# Patient Record
Sex: Female | Born: 1960 | Race: White | Hispanic: No | Marital: Married | Smoking: Never smoker
Health system: Southern US, Community
[De-identification: ages and names within clinical notes are randomized; demographics above are authoritative.]

## PROBLEM LIST (undated history)

## (undated) DIAGNOSIS — M069 Rheumatoid arthritis, unspecified: Secondary | ICD-10-CM

## (undated) DIAGNOSIS — K5090 Crohn's disease, unspecified, without complications: Secondary | ICD-10-CM

## (undated) HISTORY — PX: ABDOMINAL HYSTERECTOMY: SHX81

## (undated) HISTORY — PX: COLON SURGERY: SHX602

## (undated) HISTORY — PX: BREAST SURGERY: SHX581

## (undated) HISTORY — DX: Rheumatoid arthritis, unspecified: M06.9

## (undated) HISTORY — DX: Crohn's disease, unspecified, without complications: K50.90

## (undated) HISTORY — PX: CHOLECYSTECTOMY: SHX55

## (undated) HISTORY — PX: HERNIA REPAIR: SHX51

## (undated) HISTORY — PX: TUBAL LIGATION: SHX77

---

## 1998-05-04 ENCOUNTER — Other Ambulatory Visit: Admission: RE | Admit: 1998-05-04 | Discharge: 1998-05-04 | Payer: Self-pay | Admitting: Obstetrics & Gynecology

## 1998-12-03 ENCOUNTER — Encounter (INDEPENDENT_AMBULATORY_CARE_PROVIDER_SITE_OTHER): Payer: Self-pay

## 1998-12-03 ENCOUNTER — Ambulatory Visit (HOSPITAL_COMMUNITY): Admission: RE | Admit: 1998-12-03 | Discharge: 1998-12-03 | Payer: Self-pay | Admitting: Obstetrics & Gynecology

## 1999-01-31 ENCOUNTER — Encounter (INDEPENDENT_AMBULATORY_CARE_PROVIDER_SITE_OTHER): Payer: Self-pay

## 1999-01-31 ENCOUNTER — Inpatient Hospital Stay (HOSPITAL_COMMUNITY): Admission: RE | Admit: 1999-01-31 | Discharge: 1999-02-02 | Payer: Self-pay | Admitting: Obstetrics & Gynecology

## 2001-04-17 ENCOUNTER — Encounter: Payer: Self-pay | Admitting: Internal Medicine

## 2001-04-17 ENCOUNTER — Encounter: Admission: RE | Admit: 2001-04-17 | Discharge: 2001-04-17 | Payer: Self-pay | Admitting: Internal Medicine

## 2002-02-07 ENCOUNTER — Encounter (INDEPENDENT_AMBULATORY_CARE_PROVIDER_SITE_OTHER): Payer: Self-pay | Admitting: Specialist

## 2002-02-07 ENCOUNTER — Encounter: Payer: Self-pay | Admitting: Gastroenterology

## 2002-02-07 ENCOUNTER — Ambulatory Visit (HOSPITAL_COMMUNITY): Admission: RE | Admit: 2002-02-07 | Discharge: 2002-02-07 | Payer: Self-pay | Admitting: Gastroenterology

## 2002-06-25 ENCOUNTER — Encounter: Payer: Self-pay | Admitting: Obstetrics & Gynecology

## 2002-06-25 ENCOUNTER — Encounter: Admission: RE | Admit: 2002-06-25 | Discharge: 2002-06-25 | Payer: Self-pay | Admitting: Obstetrics & Gynecology

## 2003-06-01 ENCOUNTER — Emergency Department (HOSPITAL_COMMUNITY): Admission: EM | Admit: 2003-06-01 | Discharge: 2003-06-02 | Payer: Self-pay | Admitting: Emergency Medicine

## 2003-06-04 ENCOUNTER — Ambulatory Visit (HOSPITAL_COMMUNITY): Admission: RE | Admit: 2003-06-04 | Discharge: 2003-06-04 | Payer: Self-pay | Admitting: Internal Medicine

## 2003-09-02 ENCOUNTER — Other Ambulatory Visit: Admission: RE | Admit: 2003-09-02 | Discharge: 2003-09-02 | Payer: Self-pay | Admitting: Obstetrics & Gynecology

## 2004-01-27 ENCOUNTER — Encounter: Admission: RE | Admit: 2004-01-27 | Discharge: 2004-01-27 | Payer: Self-pay | Admitting: Internal Medicine

## 2005-07-05 ENCOUNTER — Encounter: Admission: RE | Admit: 2005-07-05 | Discharge: 2005-07-05 | Payer: Self-pay | Admitting: Internal Medicine

## 2006-05-16 ENCOUNTER — Encounter: Admission: RE | Admit: 2006-05-16 | Discharge: 2006-05-16 | Payer: Self-pay | Admitting: Rheumatology

## 2006-08-31 ENCOUNTER — Encounter: Admission: RE | Admit: 2006-08-31 | Discharge: 2006-08-31 | Payer: Self-pay | Admitting: Internal Medicine

## 2006-09-15 ENCOUNTER — Encounter: Admission: RE | Admit: 2006-09-15 | Discharge: 2006-09-15 | Payer: Self-pay | Admitting: Neurology

## 2006-12-18 ENCOUNTER — Encounter: Admission: RE | Admit: 2006-12-18 | Discharge: 2006-12-18 | Payer: Self-pay | Admitting: *Deleted

## 2008-04-20 ENCOUNTER — Encounter: Admission: RE | Admit: 2008-04-20 | Discharge: 2008-04-20 | Payer: Self-pay | Admitting: Rheumatology

## 2008-04-21 ENCOUNTER — Encounter: Admission: RE | Admit: 2008-04-21 | Discharge: 2008-04-21 | Payer: Self-pay | Admitting: Rheumatology

## 2008-05-02 ENCOUNTER — Emergency Department (HOSPITAL_COMMUNITY): Admission: EM | Admit: 2008-05-02 | Discharge: 2008-05-02 | Payer: Self-pay | Admitting: Emergency Medicine

## 2009-11-11 ENCOUNTER — Observation Stay (HOSPITAL_COMMUNITY)
Admission: EM | Admit: 2009-11-11 | Discharge: 2009-11-12 | Payer: Self-pay | Source: Home / Self Care | Admitting: Emergency Medicine

## 2010-03-27 ENCOUNTER — Encounter: Payer: Self-pay | Admitting: Rheumatology

## 2010-05-19 LAB — COMPREHENSIVE METABOLIC PANEL
ALT: 45 U/L — ABNORMAL HIGH (ref 0–35)
AST: 37 U/L (ref 0–37)
Albumin: 3.3 g/dL — ABNORMAL LOW (ref 3.5–5.2)
Albumin: 3.7 g/dL (ref 3.5–5.2)
Alkaline Phosphatase: 107 U/L (ref 39–117)
Alkaline Phosphatase: 92 U/L (ref 39–117)
CO2: 26 mEq/L (ref 19–32)
Calcium: 9.4 mg/dL (ref 8.4–10.5)
Chloride: 109 mEq/L (ref 96–112)
Creatinine, Ser: 0.72 mg/dL (ref 0.4–1.2)
GFR calc Af Amer: >60 mL/min (ref >60)
Glucose, Bld: 108 mg/dL — ABNORMAL HIGH (ref 70–99)
Glucose, Bld: 108 mg/dL — ABNORMAL HIGH (ref 70–99)
Sodium: 140 mEq/L (ref 135–145)
Total Bilirubin: 0.3 mg/dL (ref 0.3–1.2)
Total Protein: 6.5 g/dL (ref 6.0–8.3)

## 2010-05-19 LAB — CBC
HCT: 40 % (ref 36.0–46.0)
Hemoglobin: 13.4 g/dL (ref 12.0–15.0)
MCHC: 33.5 g/dL (ref 30.0–36.0)
Platelets: 217 10*3/uL (ref 150–400)
RBC: 4.85 MIL/uL (ref 3.87–5.11)
WBC: 4.9 10*3/uL (ref 4.0–10.5)

## 2010-05-19 LAB — STOOL CULTURE

## 2010-05-19 LAB — URINALYSIS, ROUTINE W REFLEX MICROSCOPIC
Hgb urine dipstick: NEGATIVE
Protein, ur: 30 mg/dL — AB
Urobilinogen, UA: 0.2 mg/dL (ref 0.0–1.0)
pH: 6 (ref 5.0–8.0)

## 2010-05-19 LAB — DIFFERENTIAL
Lymphocytes Relative: 29 % (ref 12–46)
Lymphs Abs: 1.4 10*3/uL (ref 0.7–4.0)
Monocytes Absolute: 0.6 10*3/uL (ref 0.1–1.0)
Neutro Abs: 2.7 10*3/uL (ref 1.7–7.7)
Neutrophils Relative %: 55 % (ref 43–77)

## 2010-05-19 LAB — FECAL LACTOFERRIN, QUANT: Fecal Lactoferrin: POSITIVE

## 2012-02-06 ENCOUNTER — Other Ambulatory Visit: Payer: Self-pay | Admitting: Gastroenterology

## 2012-02-06 DIAGNOSIS — K5090 Crohn's disease, unspecified, without complications: Secondary | ICD-10-CM

## 2012-02-09 ENCOUNTER — Other Ambulatory Visit: Payer: Self-pay

## 2012-02-09 ENCOUNTER — Ambulatory Visit
Admission: RE | Admit: 2012-02-09 | Discharge: 2012-02-09 | Disposition: A | Discharge status: Home / Self Care | Payer: BC Managed Care – PPO | Source: Ambulatory Visit | Attending: Gastroenterology | Admitting: Gastroenterology

## 2012-02-09 DIAGNOSIS — K5090 Crohn's disease, unspecified, without complications: Secondary | ICD-10-CM

## 2012-02-09 MED ORDER — IOHEXOL 300 MG/ML  SOLN
125.0000 mL | Freq: Once | INTRAMUSCULAR | Status: AC | PRN
  Administered 2012-02-09: 125 mL via INTRAVENOUS

## 2012-02-15 ENCOUNTER — Other Ambulatory Visit: Payer: Self-pay | Admitting: Gastroenterology

## 2012-02-21 ENCOUNTER — Ambulatory Visit (HOSPITAL_COMMUNITY)
Admission: RE | Admit: 2012-02-21 | Payer: BC Managed Care – PPO | Source: Ambulatory Visit | Admitting: Gastroenterology

## 2012-02-21 ENCOUNTER — Encounter (HOSPITAL_COMMUNITY): Admission: RE | Payer: Self-pay | Source: Ambulatory Visit

## 2012-02-21 SURGERY — COLONOSCOPY
Anesthesia: Moderate Sedation

## 2012-10-25 DIAGNOSIS — K219 Gastro-esophageal reflux disease without esophagitis: Secondary | ICD-10-CM | POA: Diagnosis not present

## 2012-10-25 DIAGNOSIS — K769 Liver disease, unspecified: Secondary | ICD-10-CM | POA: Diagnosis not present

## 2012-10-25 DIAGNOSIS — Z980 Intestinal bypass and anastomosis status: Secondary | ICD-10-CM | POA: Diagnosis not present

## 2012-10-25 DIAGNOSIS — K5080 Crohn's disease of both small and large intestine without complications: Secondary | ICD-10-CM | POA: Diagnosis not present

## 2012-10-25 DIAGNOSIS — Z79899 Other long term (current) drug therapy: Secondary | ICD-10-CM | POA: Diagnosis not present

## 2012-10-25 DIAGNOSIS — M069 Rheumatoid arthritis, unspecified: Secondary | ICD-10-CM | POA: Diagnosis not present

## 2012-10-25 DIAGNOSIS — E669 Obesity, unspecified: Secondary | ICD-10-CM | POA: Diagnosis not present

## 2012-10-25 DIAGNOSIS — K644 Residual hemorrhoidal skin tags: Secondary | ICD-10-CM | POA: Diagnosis not present

## 2012-10-25 DIAGNOSIS — J45909 Unspecified asthma, uncomplicated: Secondary | ICD-10-CM | POA: Diagnosis not present

## 2012-10-25 DIAGNOSIS — K5090 Crohn's disease, unspecified, without complications: Secondary | ICD-10-CM | POA: Diagnosis not present

## 2012-10-30 DIAGNOSIS — D235 Other benign neoplasm of skin of trunk: Secondary | ICD-10-CM | POA: Diagnosis not present

## 2012-10-30 DIAGNOSIS — D2370 Other benign neoplasm of skin of unspecified lower limb, including hip: Secondary | ICD-10-CM | POA: Diagnosis not present

## 2012-10-30 DIAGNOSIS — L719 Rosacea, unspecified: Secondary | ICD-10-CM | POA: Diagnosis not present

## 2012-11-18 DIAGNOSIS — J019 Acute sinusitis, unspecified: Secondary | ICD-10-CM | POA: Diagnosis not present

## 2012-11-18 DIAGNOSIS — J209 Acute bronchitis, unspecified: Secondary | ICD-10-CM | POA: Diagnosis not present

## 2012-12-03 DIAGNOSIS — M069 Rheumatoid arthritis, unspecified: Secondary | ICD-10-CM | POA: Diagnosis not present

## 2012-12-03 DIAGNOSIS — K5090 Crohn's disease, unspecified, without complications: Secondary | ICD-10-CM | POA: Diagnosis not present

## 2012-12-03 DIAGNOSIS — R5381 Other malaise: Secondary | ICD-10-CM | POA: Diagnosis not present

## 2012-12-03 DIAGNOSIS — Z23 Encounter for immunization: Secondary | ICD-10-CM | POA: Diagnosis not present

## 2012-12-03 DIAGNOSIS — M2550 Pain in unspecified joint: Secondary | ICD-10-CM | POA: Diagnosis not present

## 2013-01-07 DIAGNOSIS — K5090 Crohn's disease, unspecified, without complications: Secondary | ICD-10-CM | POA: Diagnosis not present

## 2013-01-07 DIAGNOSIS — Z6838 Body mass index (BMI) 38.0-38.9, adult: Secondary | ICD-10-CM | POA: Diagnosis not present

## 2013-01-07 DIAGNOSIS — K439 Ventral hernia without obstruction or gangrene: Secondary | ICD-10-CM | POA: Diagnosis not present

## 2013-01-07 DIAGNOSIS — R7989 Other specified abnormal findings of blood chemistry: Secondary | ICD-10-CM | POA: Diagnosis not present

## 2013-01-24 DIAGNOSIS — H5200 Hypermetropia, unspecified eye: Secondary | ICD-10-CM | POA: Diagnosis not present

## 2013-01-27 DIAGNOSIS — Z09 Encounter for follow-up examination after completed treatment for conditions other than malignant neoplasm: Secondary | ICD-10-CM | POA: Diagnosis not present

## 2013-01-27 DIAGNOSIS — N6009 Solitary cyst of unspecified breast: Secondary | ICD-10-CM | POA: Diagnosis not present

## 2013-02-05 DIAGNOSIS — K5090 Crohn's disease, unspecified, without complications: Secondary | ICD-10-CM | POA: Diagnosis not present

## 2013-02-05 DIAGNOSIS — M2550 Pain in unspecified joint: Secondary | ICD-10-CM | POA: Diagnosis not present

## 2013-02-05 DIAGNOSIS — M069 Rheumatoid arthritis, unspecified: Secondary | ICD-10-CM | POA: Diagnosis not present

## 2013-02-05 DIAGNOSIS — R7989 Other specified abnormal findings of blood chemistry: Secondary | ICD-10-CM | POA: Diagnosis not present

## 2013-02-11 DIAGNOSIS — L259 Unspecified contact dermatitis, unspecified cause: Secondary | ICD-10-CM | POA: Diagnosis not present

## 2013-02-11 DIAGNOSIS — L821 Other seborrheic keratosis: Secondary | ICD-10-CM | POA: Diagnosis not present

## 2013-02-20 DIAGNOSIS — R1011 Right upper quadrant pain: Secondary | ICD-10-CM | POA: Diagnosis not present

## 2013-02-20 DIAGNOSIS — Z6838 Body mass index (BMI) 38.0-38.9, adult: Secondary | ICD-10-CM | POA: Diagnosis not present

## 2013-03-17 DIAGNOSIS — L408 Other psoriasis: Secondary | ICD-10-CM | POA: Diagnosis not present

## 2013-03-17 DIAGNOSIS — Z79899 Other long term (current) drug therapy: Secondary | ICD-10-CM | POA: Diagnosis not present

## 2013-03-17 DIAGNOSIS — M069 Rheumatoid arthritis, unspecified: Secondary | ICD-10-CM | POA: Diagnosis not present

## 2013-03-17 DIAGNOSIS — K5080 Crohn's disease of both small and large intestine without complications: Secondary | ICD-10-CM | POA: Diagnosis not present

## 2013-03-17 DIAGNOSIS — Z9889 Other specified postprocedural states: Secondary | ICD-10-CM | POA: Diagnosis not present

## 2013-03-17 DIAGNOSIS — K219 Gastro-esophageal reflux disease without esophagitis: Secondary | ICD-10-CM | POA: Diagnosis not present

## 2013-03-17 DIAGNOSIS — K469 Unspecified abdominal hernia without obstruction or gangrene: Secondary | ICD-10-CM | POA: Diagnosis not present

## 2013-03-31 DIAGNOSIS — K5080 Crohn's disease of both small and large intestine without complications: Secondary | ICD-10-CM | POA: Diagnosis not present

## 2013-03-31 DIAGNOSIS — L259 Unspecified contact dermatitis, unspecified cause: Secondary | ICD-10-CM | POA: Diagnosis not present

## 2013-04-15 DIAGNOSIS — E669 Obesity, unspecified: Secondary | ICD-10-CM | POA: Diagnosis not present

## 2013-04-15 DIAGNOSIS — M1990 Unspecified osteoarthritis, unspecified site: Secondary | ICD-10-CM | POA: Diagnosis not present

## 2013-04-15 DIAGNOSIS — R141 Gas pain: Secondary | ICD-10-CM | POA: Diagnosis not present

## 2013-04-15 DIAGNOSIS — Z79899 Other long term (current) drug therapy: Secondary | ICD-10-CM | POA: Diagnosis not present

## 2013-04-15 DIAGNOSIS — R143 Flatulence: Secondary | ICD-10-CM | POA: Diagnosis not present

## 2013-04-15 DIAGNOSIS — Z23 Encounter for immunization: Secondary | ICD-10-CM | POA: Diagnosis not present

## 2013-04-15 DIAGNOSIS — K7689 Other specified diseases of liver: Secondary | ICD-10-CM | POA: Diagnosis not present

## 2013-04-15 DIAGNOSIS — E781 Pure hyperglyceridemia: Secondary | ICD-10-CM | POA: Diagnosis not present

## 2013-04-15 DIAGNOSIS — N951 Menopausal and female climacteric states: Secondary | ICD-10-CM | POA: Diagnosis not present

## 2013-04-15 DIAGNOSIS — M069 Rheumatoid arthritis, unspecified: Secondary | ICD-10-CM | POA: Diagnosis not present

## 2013-04-15 DIAGNOSIS — Z0000 Encounter for general adult medical examination without abnormal findings: Secondary | ICD-10-CM | POA: Diagnosis not present

## 2013-04-15 DIAGNOSIS — K5090 Crohn's disease, unspecified, without complications: Secondary | ICD-10-CM | POA: Diagnosis not present

## 2013-04-15 DIAGNOSIS — G589 Mononeuropathy, unspecified: Secondary | ICD-10-CM | POA: Diagnosis not present

## 2013-05-06 DIAGNOSIS — M069 Rheumatoid arthritis, unspecified: Secondary | ICD-10-CM | POA: Diagnosis not present

## 2013-05-06 DIAGNOSIS — K5090 Crohn's disease, unspecified, without complications: Secondary | ICD-10-CM | POA: Diagnosis not present

## 2013-05-06 DIAGNOSIS — M2550 Pain in unspecified joint: Secondary | ICD-10-CM | POA: Diagnosis not present

## 2013-05-06 DIAGNOSIS — R21 Rash and other nonspecific skin eruption: Secondary | ICD-10-CM | POA: Diagnosis not present

## 2013-05-26 DIAGNOSIS — R7989 Other specified abnormal findings of blood chemistry: Secondary | ICD-10-CM | POA: Diagnosis not present

## 2013-06-03 DIAGNOSIS — Z79899 Other long term (current) drug therapy: Secondary | ICD-10-CM | POA: Diagnosis not present

## 2013-06-03 DIAGNOSIS — M069 Rheumatoid arthritis, unspecified: Secondary | ICD-10-CM | POA: Diagnosis not present

## 2013-06-03 DIAGNOSIS — R7402 Elevation of levels of lactic acid dehydrogenase (LDH): Secondary | ICD-10-CM | POA: Diagnosis not present

## 2013-06-03 DIAGNOSIS — R7401 Elevation of levels of liver transaminase levels: Secondary | ICD-10-CM | POA: Diagnosis not present

## 2013-06-03 DIAGNOSIS — L568 Other specified acute skin changes due to ultraviolet radiation: Secondary | ICD-10-CM | POA: Diagnosis not present

## 2013-06-03 DIAGNOSIS — L408 Other psoriasis: Secondary | ICD-10-CM | POA: Diagnosis not present

## 2013-06-03 DIAGNOSIS — K5090 Crohn's disease, unspecified, without complications: Secondary | ICD-10-CM | POA: Diagnosis not present

## 2013-07-06 IMAGING — CT CT ENTEROGRAPHY (ABD-PELV W/ CM)
2 of 6 series · 11 of 36 positions shown, 18 images · IV contrast (VOLUMEN [ID] ML & [ID] OMNI 300)
Comparison: CT scan 01/22/2012.

CLINICAL DATA: Crohn's disease and right-sided abdominal pain.

CT ABDOMEN AND PELVIS WITH CONTRAST (CT ENTEROGRAPHY)
TECHNIQUE: Multidetector CT of the abdomen and pelvis during bolus
administration of intravenous contrast. Negative oral contrast
VoLumen was given.
Contrast: 125mL OMNIPAQUE IOHEXOL 300 MG/ML  SOLN

[Series 3: enterography (id) · axial · 0.74mm/px · z∈[-475,-87]mm · 10 of 186 slices shown, 16 images]
[im 16/186  soft-tissue]
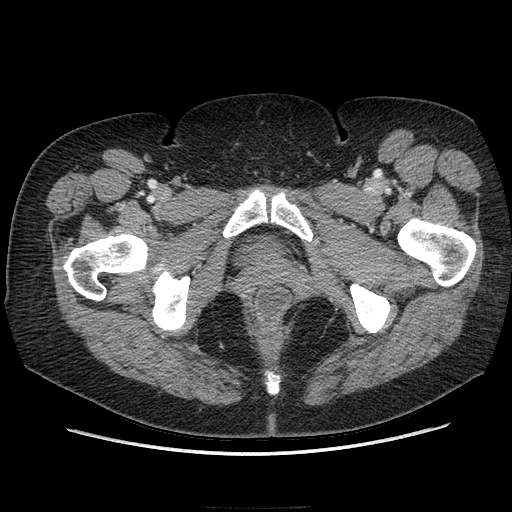
[im 16/186  bone]
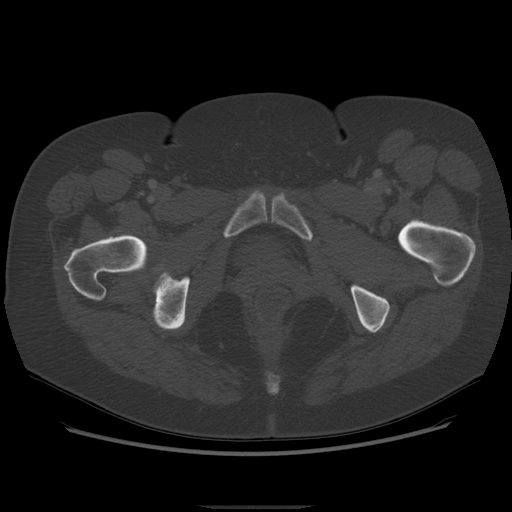
[im 31/186  soft-tissue]
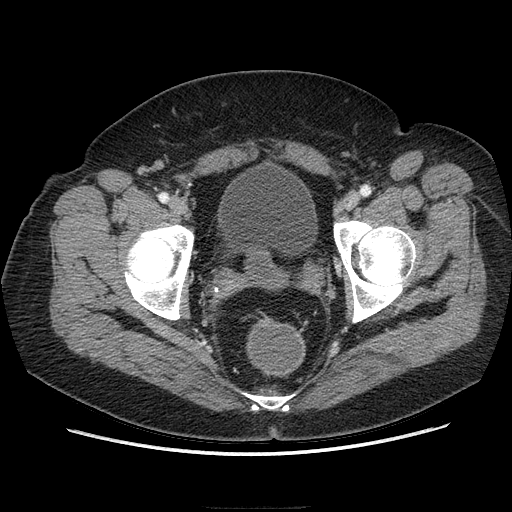
[im 47/186  soft-tissue]
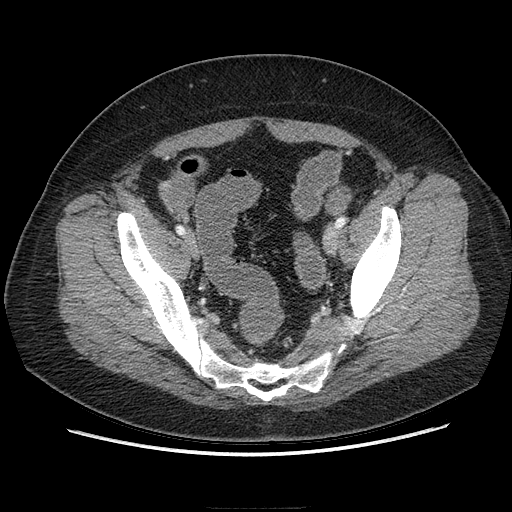
[im 62/186  soft-tissue]
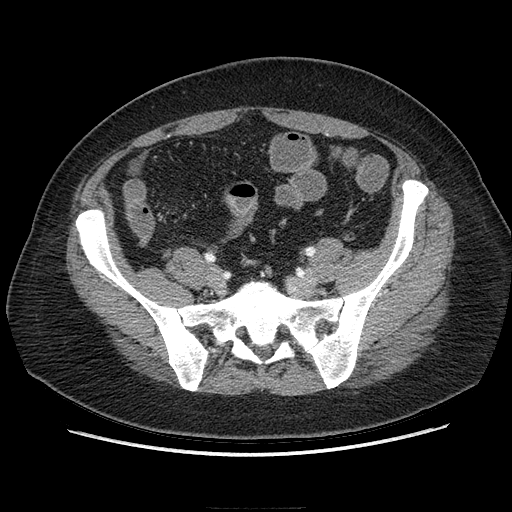
[im 78/186  soft-tissue]
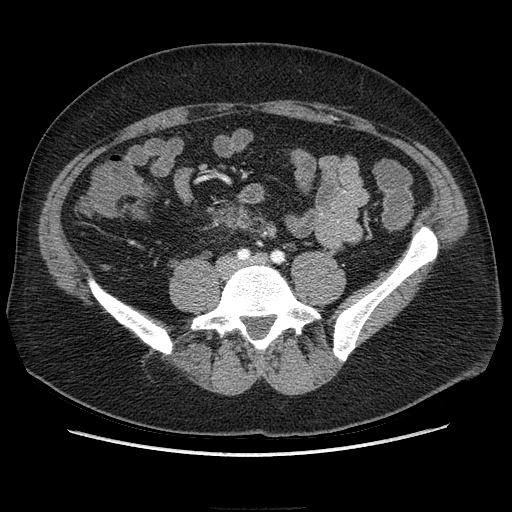
[im 108/186  soft-tissue]
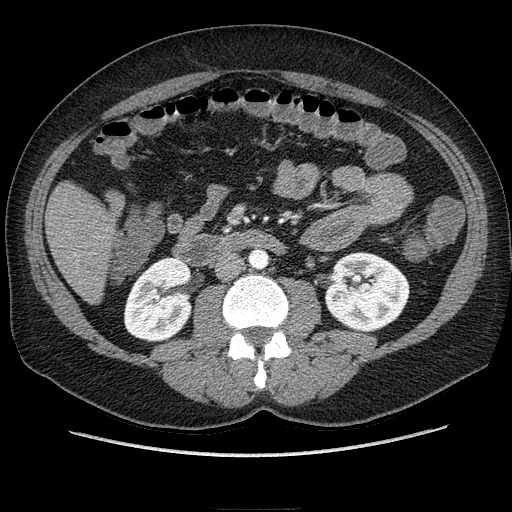
[im 124/186  soft-tissue]
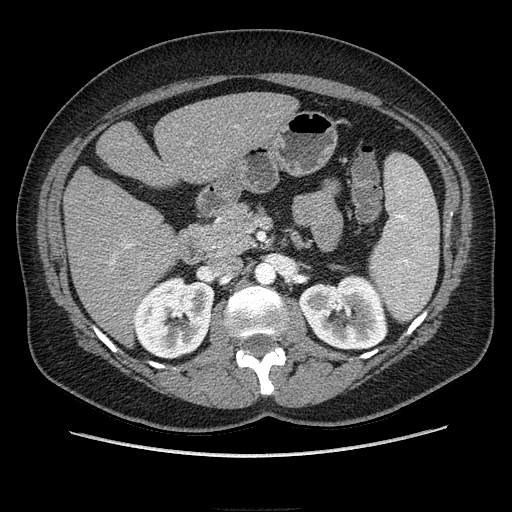
[im 124/186  lung]
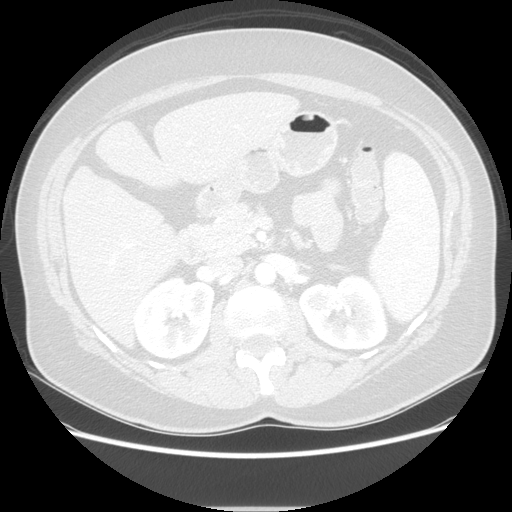
[im 139/186  soft-tissue]
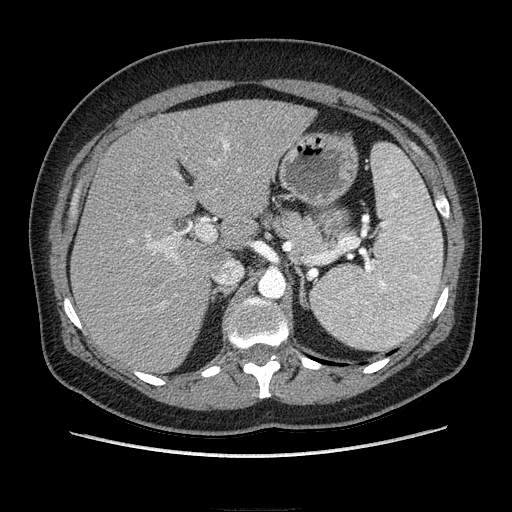
[im 139/186  lung]
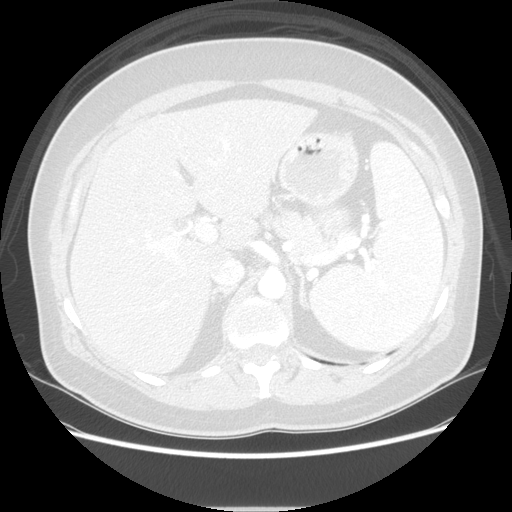
[im 155/186  soft-tissue]
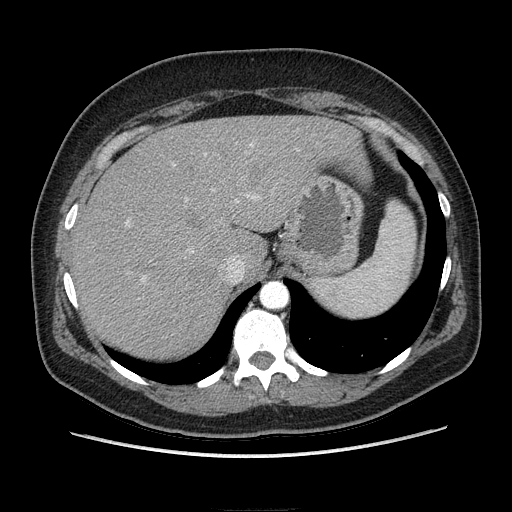
[im 155/186  lung]
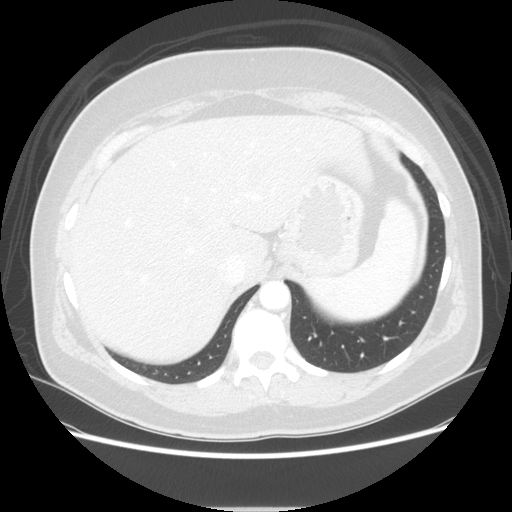
[im 155/186  bone]
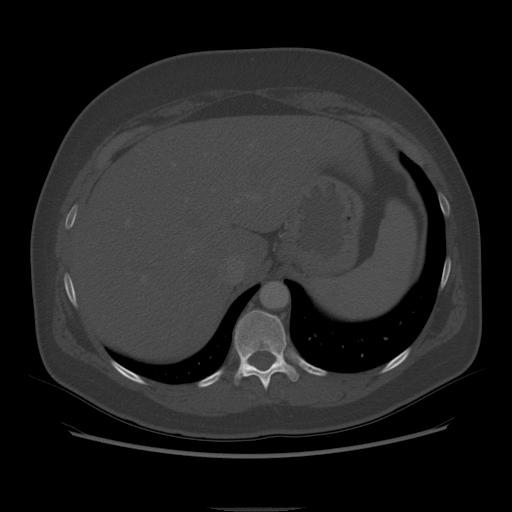
[im 170/186  soft-tissue]
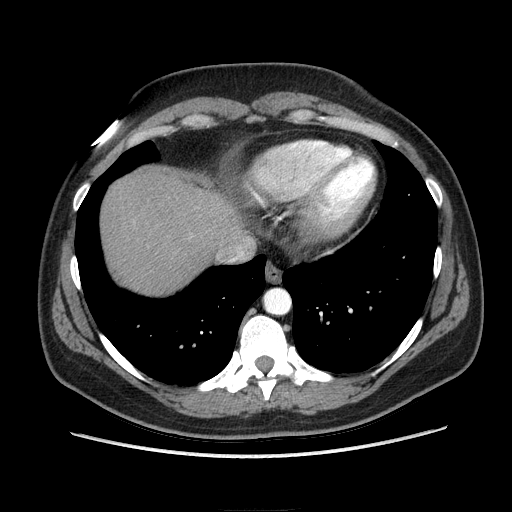
[im 170/186  lung]
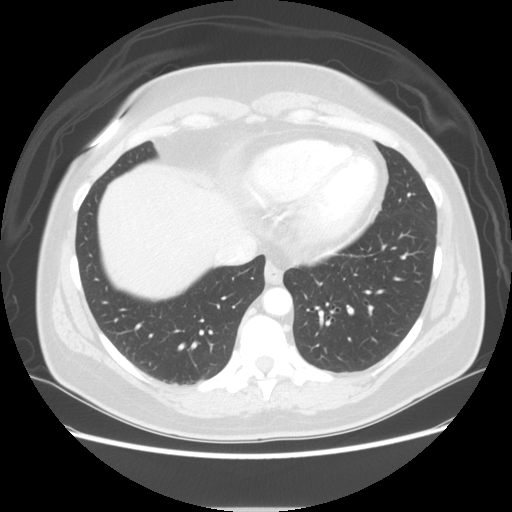

[Series 601: coronal body · coronal · 0.95mm/px · 1 of 126 slices shown, 2 images]
[im 42/126  soft-tissue]
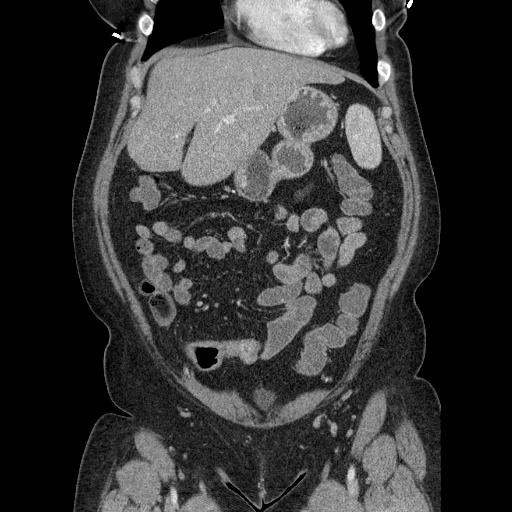
[im 42/126  bone]
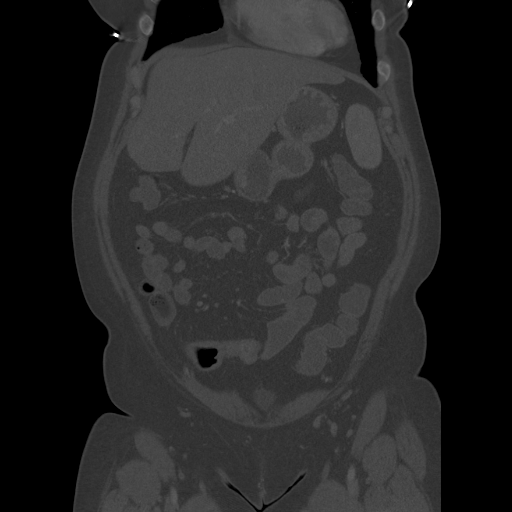

[11 of 36 positions shown; findings below may reference images not displayed]

FINDINGS: The lung bases are clear.  No pleural effusion or
pulmonary nodule.

There is mild diffuse fatty infiltration of the liver but no focal
hepatic lesions or intrahepatic biliary dilatation.  The
gallbladder surgically absent.  No common bile duct dilatation.
The pancreas is normal.  The spleen is borderline enlarged but
stable.  No focal lesions.  The adrenal glands and kidneys are
normal.

The stomach, duodenum and proximal small bowel are normal.  As
demonstrated on the prior CT scan there is diffuse small bowel wall
thickening involving the distal ileum with submucosal edema and
mucosal enhancement.  Findings consist with active Crohn's disease.
The terminal ileum appears normal and the cecum is normal.  The
appendix is normal.  In the small bowel mesentery near the inflamed
small bowel loops is an ill-defined inflammatory "mass".  This is
larger and more discrete than it was on the prior CT examination.
This is concerning for a phlegmonous area possibly due to a
fistulous tract from the inflamed small bowel loop (see coronal
image 63). No discrete drainable abscess is identified.  There are
a few small scattered adjacent lymph nodes.

The uterus is surgically absent.  No pelvic mass, adenopathy or
free pelvic fluid collections.  The rectum and sigmoid colon are
unremarkable.

No significant bony findings.
IMPRESSION: 1.  Persistent inflammation of the distal ileum consistent with
active Crohn's disease.
2.  Enlarging and more distinct inflammatory process in the small
bowel mesentery worrisome for a fistulous communication and
phlegmon.  No discrete drainable abscess.  Recommend close
surveillance.
3.  No inflammatory bowel disease involving the colon.
4.  Stable diffuse fatty infiltration of the liver and mild splenic
enlargement.

## 2013-08-05 DIAGNOSIS — R7989 Other specified abnormal findings of blood chemistry: Secondary | ICD-10-CM | POA: Diagnosis not present

## 2013-08-05 DIAGNOSIS — K5090 Crohn's disease, unspecified, without complications: Secondary | ICD-10-CM | POA: Diagnosis not present

## 2013-08-05 DIAGNOSIS — M069 Rheumatoid arthritis, unspecified: Secondary | ICD-10-CM | POA: Diagnosis not present

## 2013-08-05 DIAGNOSIS — M2550 Pain in unspecified joint: Secondary | ICD-10-CM | POA: Diagnosis not present

## 2013-08-05 DIAGNOSIS — Z79899 Other long term (current) drug therapy: Secondary | ICD-10-CM | POA: Diagnosis not present

## 2013-08-12 DIAGNOSIS — M069 Rheumatoid arthritis, unspecified: Secondary | ICD-10-CM | POA: Diagnosis not present

## 2013-08-12 DIAGNOSIS — M2550 Pain in unspecified joint: Secondary | ICD-10-CM | POA: Diagnosis not present

## 2013-08-12 DIAGNOSIS — M549 Dorsalgia, unspecified: Secondary | ICD-10-CM | POA: Diagnosis not present

## 2013-08-12 DIAGNOSIS — K5090 Crohn's disease, unspecified, without complications: Secondary | ICD-10-CM | POA: Diagnosis not present

## 2013-08-25 DIAGNOSIS — K739 Chronic hepatitis, unspecified: Secondary | ICD-10-CM | POA: Diagnosis not present

## 2013-08-25 DIAGNOSIS — R7989 Other specified abnormal findings of blood chemistry: Secondary | ICD-10-CM | POA: Diagnosis not present

## 2013-08-25 DIAGNOSIS — K7460 Unspecified cirrhosis of liver: Secondary | ICD-10-CM | POA: Diagnosis not present

## 2013-08-25 DIAGNOSIS — M069 Rheumatoid arthritis, unspecified: Secondary | ICD-10-CM | POA: Diagnosis not present

## 2013-08-25 DIAGNOSIS — K5090 Crohn's disease, unspecified, without complications: Secondary | ICD-10-CM | POA: Diagnosis not present

## 2013-08-25 DIAGNOSIS — K769 Liver disease, unspecified: Secondary | ICD-10-CM | POA: Diagnosis not present

## 2013-09-02 DIAGNOSIS — Z09 Encounter for follow-up examination after completed treatment for conditions other than malignant neoplasm: Secondary | ICD-10-CM | POA: Diagnosis not present

## 2013-09-02 DIAGNOSIS — R928 Other abnormal and inconclusive findings on diagnostic imaging of breast: Secondary | ICD-10-CM | POA: Diagnosis not present

## 2013-09-10 DIAGNOSIS — K769 Liver disease, unspecified: Secondary | ICD-10-CM | POA: Diagnosis not present

## 2013-09-10 DIAGNOSIS — R160 Hepatomegaly, not elsewhere classified: Secondary | ICD-10-CM | POA: Diagnosis not present

## 2013-09-10 DIAGNOSIS — R161 Splenomegaly, not elsewhere classified: Secondary | ICD-10-CM | POA: Diagnosis not present

## 2013-09-10 DIAGNOSIS — R7989 Other specified abnormal findings of blood chemistry: Secondary | ICD-10-CM | POA: Diagnosis not present

## 2013-09-10 DIAGNOSIS — K7689 Other specified diseases of liver: Secondary | ICD-10-CM | POA: Diagnosis not present

## 2013-10-07 DIAGNOSIS — M069 Rheumatoid arthritis, unspecified: Secondary | ICD-10-CM | POA: Diagnosis not present

## 2013-10-07 DIAGNOSIS — R7989 Other specified abnormal findings of blood chemistry: Secondary | ICD-10-CM | POA: Diagnosis not present

## 2013-10-07 DIAGNOSIS — L408 Other psoriasis: Secondary | ICD-10-CM | POA: Diagnosis not present

## 2013-10-07 DIAGNOSIS — K5090 Crohn's disease, unspecified, without complications: Secondary | ICD-10-CM | POA: Diagnosis not present

## 2013-10-07 DIAGNOSIS — Z79899 Other long term (current) drug therapy: Secondary | ICD-10-CM | POA: Diagnosis not present

## 2013-10-07 DIAGNOSIS — K651 Peritoneal abscess: Secondary | ICD-10-CM | POA: Diagnosis not present

## 2013-10-07 DIAGNOSIS — R945 Abnormal results of liver function studies: Secondary | ICD-10-CM | POA: Diagnosis not present

## 2013-10-07 DIAGNOSIS — E559 Vitamin D deficiency, unspecified: Secondary | ICD-10-CM | POA: Diagnosis not present

## 2013-10-17 DIAGNOSIS — K5090 Crohn's disease, unspecified, without complications: Secondary | ICD-10-CM | POA: Diagnosis not present

## 2013-10-21 DIAGNOSIS — R5381 Other malaise: Secondary | ICD-10-CM | POA: Diagnosis not present

## 2013-10-21 DIAGNOSIS — K5090 Crohn's disease, unspecified, without complications: Secondary | ICD-10-CM | POA: Diagnosis not present

## 2013-10-21 DIAGNOSIS — F4321 Adjustment disorder with depressed mood: Secondary | ICD-10-CM | POA: Diagnosis not present

## 2013-10-21 DIAGNOSIS — M069 Rheumatoid arthritis, unspecified: Secondary | ICD-10-CM | POA: Diagnosis not present

## 2013-10-21 DIAGNOSIS — R030 Elevated blood-pressure reading, without diagnosis of hypertension: Secondary | ICD-10-CM | POA: Diagnosis not present

## 2013-10-21 DIAGNOSIS — E669 Obesity, unspecified: Secondary | ICD-10-CM | POA: Diagnosis not present

## 2013-10-21 DIAGNOSIS — D649 Anemia, unspecified: Secondary | ICD-10-CM | POA: Diagnosis not present

## 2013-10-21 DIAGNOSIS — Z1331 Encounter for screening for depression: Secondary | ICD-10-CM | POA: Diagnosis not present

## 2013-10-21 DIAGNOSIS — K7689 Other specified diseases of liver: Secondary | ICD-10-CM | POA: Diagnosis not present

## 2013-10-21 DIAGNOSIS — R5383 Other fatigue: Secondary | ICD-10-CM | POA: Diagnosis not present

## 2013-10-27 DIAGNOSIS — K5080 Crohn's disease of both small and large intestine without complications: Secondary | ICD-10-CM | POA: Diagnosis not present

## 2013-10-30 DIAGNOSIS — D2370 Other benign neoplasm of skin of unspecified lower limb, including hip: Secondary | ICD-10-CM | POA: Diagnosis not present

## 2013-10-30 DIAGNOSIS — D235 Other benign neoplasm of skin of trunk: Secondary | ICD-10-CM | POA: Diagnosis not present

## 2013-10-30 DIAGNOSIS — L821 Other seborrheic keratosis: Secondary | ICD-10-CM | POA: Diagnosis not present

## 2013-10-30 DIAGNOSIS — C44711 Basal cell carcinoma of skin of unspecified lower limb, including hip: Secondary | ICD-10-CM | POA: Diagnosis not present

## 2013-11-07 DIAGNOSIS — K5080 Crohn's disease of both small and large intestine without complications: Secondary | ICD-10-CM | POA: Diagnosis not present

## 2013-11-07 DIAGNOSIS — Z881 Allergy status to other antibiotic agents status: Secondary | ICD-10-CM | POA: Diagnosis not present

## 2013-11-07 DIAGNOSIS — Z980 Intestinal bypass and anastomosis status: Secondary | ICD-10-CM | POA: Diagnosis not present

## 2013-11-07 DIAGNOSIS — M069 Rheumatoid arthritis, unspecified: Secondary | ICD-10-CM | POA: Diagnosis not present

## 2013-11-07 DIAGNOSIS — Z882 Allergy status to sulfonamides status: Secondary | ICD-10-CM | POA: Diagnosis not present

## 2013-11-07 DIAGNOSIS — K219 Gastro-esophageal reflux disease without esophagitis: Secondary | ICD-10-CM | POA: Diagnosis not present

## 2013-11-07 DIAGNOSIS — K644 Residual hemorrhoidal skin tags: Secondary | ICD-10-CM | POA: Diagnosis not present

## 2013-11-12 DIAGNOSIS — K5090 Crohn's disease, unspecified, without complications: Secondary | ICD-10-CM | POA: Diagnosis not present

## 2013-11-14 DIAGNOSIS — J189 Pneumonia, unspecified organism: Secondary | ICD-10-CM | POA: Diagnosis not present

## 2013-12-01 DIAGNOSIS — K5090 Crohn's disease, unspecified, without complications: Secondary | ICD-10-CM | POA: Diagnosis not present

## 2013-12-10 DIAGNOSIS — K50819 Crohn's disease of both small and large intestine with unspecified complications: Secondary | ICD-10-CM | POA: Diagnosis not present

## 2013-12-15 DIAGNOSIS — Z23 Encounter for immunization: Secondary | ICD-10-CM | POA: Diagnosis not present

## 2014-01-06 DIAGNOSIS — K50819 Crohn's disease of both small and large intestine with unspecified complications: Secondary | ICD-10-CM | POA: Diagnosis not present

## 2014-02-05 DIAGNOSIS — K50819 Crohn's disease of both small and large intestine with unspecified complications: Secondary | ICD-10-CM | POA: Diagnosis not present

## 2014-03-23 DIAGNOSIS — Z79899 Other long term (current) drug therapy: Secondary | ICD-10-CM | POA: Diagnosis not present

## 2014-03-23 DIAGNOSIS — H5203 Hypermetropia, bilateral: Secondary | ICD-10-CM | POA: Diagnosis not present

## 2014-04-02 DIAGNOSIS — K50819 Crohn's disease of both small and large intestine with unspecified complications: Secondary | ICD-10-CM | POA: Diagnosis not present

## 2014-04-14 DIAGNOSIS — K5090 Crohn's disease, unspecified, without complications: Secondary | ICD-10-CM | POA: Diagnosis not present

## 2014-04-14 DIAGNOSIS — L539 Erythematous condition, unspecified: Secondary | ICD-10-CM | POA: Diagnosis not present

## 2014-04-14 DIAGNOSIS — Z6837 Body mass index (BMI) 37.0-37.9, adult: Secondary | ICD-10-CM | POA: Diagnosis not present

## 2014-04-14 DIAGNOSIS — R7989 Other specified abnormal findings of blood chemistry: Secondary | ICD-10-CM | POA: Diagnosis not present

## 2014-04-14 DIAGNOSIS — Z79899 Other long term (current) drug therapy: Secondary | ICD-10-CM | POA: Diagnosis not present

## 2014-04-14 DIAGNOSIS — K219 Gastro-esophageal reflux disease without esophagitis: Secondary | ICD-10-CM | POA: Diagnosis not present

## 2014-04-14 DIAGNOSIS — L409 Psoriasis, unspecified: Secondary | ICD-10-CM | POA: Diagnosis not present

## 2014-04-14 DIAGNOSIS — K50819 Crohn's disease of both small and large intestine with unspecified complications: Secondary | ICD-10-CM | POA: Diagnosis not present

## 2014-04-14 DIAGNOSIS — M069 Rheumatoid arthritis, unspecified: Secondary | ICD-10-CM | POA: Diagnosis not present

## 2014-04-30 DIAGNOSIS — R030 Elevated blood-pressure reading, without diagnosis of hypertension: Secondary | ICD-10-CM | POA: Diagnosis not present

## 2014-04-30 DIAGNOSIS — Z1389 Encounter for screening for other disorder: Secondary | ICD-10-CM | POA: Diagnosis not present

## 2014-04-30 DIAGNOSIS — R739 Hyperglycemia, unspecified: Secondary | ICD-10-CM | POA: Diagnosis not present

## 2014-04-30 DIAGNOSIS — Z23 Encounter for immunization: Secondary | ICD-10-CM | POA: Diagnosis not present

## 2014-04-30 DIAGNOSIS — M791 Myalgia: Secondary | ICD-10-CM | POA: Diagnosis not present

## 2014-04-30 DIAGNOSIS — K5090 Crohn's disease, unspecified, without complications: Secondary | ICD-10-CM | POA: Diagnosis not present

## 2014-04-30 DIAGNOSIS — R5383 Other fatigue: Secondary | ICD-10-CM | POA: Diagnosis not present

## 2014-04-30 DIAGNOSIS — G629 Polyneuropathy, unspecified: Secondary | ICD-10-CM | POA: Diagnosis not present

## 2014-04-30 DIAGNOSIS — E538 Deficiency of other specified B group vitamins: Secondary | ICD-10-CM | POA: Diagnosis not present

## 2014-04-30 DIAGNOSIS — Z0000 Encounter for general adult medical examination without abnormal findings: Secondary | ICD-10-CM | POA: Diagnosis not present

## 2014-04-30 DIAGNOSIS — E781 Pure hyperglyceridemia: Secondary | ICD-10-CM | POA: Diagnosis not present

## 2014-05-23 DIAGNOSIS — J040 Acute laryngitis: Secondary | ICD-10-CM | POA: Diagnosis not present

## 2014-05-23 DIAGNOSIS — J0100 Acute maxillary sinusitis, unspecified: Secondary | ICD-10-CM | POA: Diagnosis not present

## 2014-05-23 DIAGNOSIS — J029 Acute pharyngitis, unspecified: Secondary | ICD-10-CM | POA: Diagnosis not present

## 2014-05-23 DIAGNOSIS — R062 Wheezing: Secondary | ICD-10-CM | POA: Diagnosis not present

## 2014-05-25 DIAGNOSIS — Z6836 Body mass index (BMI) 36.0-36.9, adult: Secondary | ICD-10-CM | POA: Diagnosis not present

## 2014-05-25 DIAGNOSIS — K7581 Nonalcoholic steatohepatitis (NASH): Secondary | ICD-10-CM | POA: Diagnosis not present

## 2014-05-27 DIAGNOSIS — K50819 Crohn's disease of both small and large intestine with unspecified complications: Secondary | ICD-10-CM | POA: Diagnosis not present

## 2014-05-27 DIAGNOSIS — Z79899 Other long term (current) drug therapy: Secondary | ICD-10-CM | POA: Diagnosis not present

## 2014-06-10 DIAGNOSIS — J209 Acute bronchitis, unspecified: Secondary | ICD-10-CM | POA: Diagnosis not present

## 2014-07-03 DIAGNOSIS — J189 Pneumonia, unspecified organism: Secondary | ICD-10-CM | POA: Diagnosis not present

## 2014-07-03 DIAGNOSIS — L820 Inflamed seborrheic keratosis: Secondary | ICD-10-CM | POA: Diagnosis not present

## 2014-07-06 DIAGNOSIS — E538 Deficiency of other specified B group vitamins: Secondary | ICD-10-CM | POA: Diagnosis not present

## 2014-07-06 DIAGNOSIS — Z6838 Body mass index (BMI) 38.0-38.9, adult: Secondary | ICD-10-CM | POA: Diagnosis not present

## 2014-07-06 DIAGNOSIS — J45909 Unspecified asthma, uncomplicated: Secondary | ICD-10-CM | POA: Diagnosis not present

## 2014-07-06 DIAGNOSIS — R0602 Shortness of breath: Secondary | ICD-10-CM | POA: Diagnosis not present

## 2014-07-06 DIAGNOSIS — R05 Cough: Secondary | ICD-10-CM | POA: Diagnosis not present

## 2014-07-21 DIAGNOSIS — K50819 Crohn's disease of both small and large intestine with unspecified complications: Secondary | ICD-10-CM | POA: Diagnosis not present

## 2014-07-21 DIAGNOSIS — R05 Cough: Secondary | ICD-10-CM | POA: Diagnosis not present

## 2014-07-21 DIAGNOSIS — R911 Solitary pulmonary nodule: Secondary | ICD-10-CM | POA: Diagnosis not present

## 2014-07-29 ENCOUNTER — Ambulatory Visit (INDEPENDENT_AMBULATORY_CARE_PROVIDER_SITE_OTHER): Payer: Medicare Other | Admitting: Emergency Medicine

## 2014-07-29 ENCOUNTER — Encounter (INDEPENDENT_AMBULATORY_CARE_PROVIDER_SITE_OTHER): Payer: Self-pay

## 2014-07-29 ENCOUNTER — Encounter: Payer: Self-pay | Admitting: Emergency Medicine

## 2014-07-29 VITALS — BP 130/80 | HR 94 | Ht 67.0 in | Wt 241.0 lb

## 2014-07-29 DIAGNOSIS — R05 Cough: Secondary | ICD-10-CM | POA: Diagnosis not present

## 2014-07-29 DIAGNOSIS — R053 Chronic cough: Secondary | ICD-10-CM | POA: Insufficient documentation

## 2014-07-29 DIAGNOSIS — R059 Cough, unspecified: Secondary | ICD-10-CM

## 2014-07-29 MED ORDER — HYDROCOD POLST-CPM POLST ER 10-8 MG/5ML PO SUER: 5.0000 mL | Freq: Two times a day (BID) | ORAL | Status: DC | PRN

## 2014-07-29 NOTE — Progress Notes (Signed)
Subjective:    Patient ID: Jacqueline Merritt, female    DOB: 1960-03-07, 54 y.o.   MRN: [id number]  HPI 54 year old never smoker with a history of childhood asthma, Crohn's disease s/p ileocolonic resection, rheumatoid arthritis on biologic (Remicade). She has formerly been on other biologics and also MTX (stopped ~ 2013).   Beginning 3/'16 she was evaluated for dry cough, dyspnea, hoarse voice, no better after abx. Then in April tried more abx and started nasal steroid + allegra. She got worse instead of better, more dry cough, more fatigue. Then treated with prednisone taper, may have been temporarily better but no big change. In May was started on nebulized BD's. Finally she was started on Flovent. Nothing had changed any of this. She has used Tussin.                                            Review of Systems  Constitutional: Negative for fever and unexpected weight change.  HENT: Positive for sore throat. Negative for congestion, dental problem, ear pain, nosebleeds, postnasal drip, rhinorrhea, sinus pressure, sneezing and trouble swallowing.   Eyes: Negative for redness and itching.  Respiratory: Positive for cough and shortness of breath. Negative for chest tightness and wheezing.   Cardiovascular: Positive for chest pain. Negative for palpitations and leg swelling.  Gastrointestinal: Negative for nausea and vomiting.  Genitourinary: Negative for dysuria.  Musculoskeletal: Negative for joint swelling.  Skin: Positive for rash.  Neurological: Positive for headaches.  Hematological: Does not bruise/bleed easily.  Psychiatric/Behavioral: Negative for dysphoric mood. The patient is not nervous/anxious.    Past Medical History  Diagnosis Date  . Rheumatoid arthritis   . Crohn's disease      No family history on file.   History   Social History  . Marital Status: Married    Spouse Name: N/A  . Number of Children: N/A  . Years of Education: N/A   Occupational  History  . Not on file.   Social History Main Topics  . Smoking status: Never Smoker   . Smokeless tobacco: Not on file  . Alcohol Use: No  . Drug Use: No  . Sexual Activity: Not on file   Other Topics Concern  . Not on file   Social History Narrative  . No narrative on file     Allergies  Allergen Reactions  . Sulfa Antibiotics Rash     No outpatient prescriptions prior to visit.   No facility-administered medications prior to visit.         Objective:   Physical Exam Filed Vitals:   07/29/14 1609  BP: 130/80  Pulse: 94  Height: 5\' 7"  (1.702 m)  Weight: 241 lb (109.317 kg)  SpO2: 95%   Gen: Pleasant, well-nourished, in no distress,  normal affect  ENT: No lesions,  mouth clear,  oropharynx clear, no postnasal drip  Neck: No JVD, no TMG, no carotid bruits  Lungs: No use of accessory muscles, no dullness to percussion, clear without rales or rhonchi  Cardiovascular: RRR, heart sounds normal, no murmur or gallops, no peripheral edema  Musculoskeletal: No deformities, no cyanosis or clubbing  Neuro: alert, non focal  Skin: Warm, no lesions or rashes      Assessment & Plan:  Chronic cough Chronic cough and a complicated patient with a history of autoimmune disease (Crohn's and rheumatoid arthritis). She  has been treated with biologics and is currently on Remicade. She is also been treated with methotrexate. 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 I will obtain the CT scan for review. Her symptoms and her description of her response to therapy sounds like upper airway disease. We will treat as such, check pulmonary function testing, stop Flovent. I don't see any current contraindications to receiving her next Remicade infusion. Over 75% of this 40 minute visit was spent in counseling.      Please restart your fluticasone nasal spray and Allegra Please increase your omeprazole to  20 mg twice a day until next visit You may proceed with your Remicade infusion We will use Tussionex 5 mL every 12 hours for cough suppression Try to avoid throat clearing. Use sugar-free candy and swallow instead of clearing her throat Try to set aside a dedicated time for voice rest Stop Flovent You may still have albuterol/ipratropium nebulizers available to use as needed We will obtain a copy of your CT scan from [facility] If her cough continues we may consider inspecting your upper airway and bronchi for any anatomical abnormalities or thrush We will perform full pulmonary function testing at your next office visit Follow with Dr Lamonte Sakai in 1 month with full PFT

## 2014-07-29 NOTE — Assessment & Plan Note (Addendum)
Chronic cough and a complicated patient with a history of autoimmune disease (Crohn's and rheumatoid arthritis). She has been treated with biologics and is currently on Remicade. She is also been treated with methotrexate. 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 I will obtain the CT scan for review. Her symptoms and her description of her response to therapy sounds like upper airway disease. We will treat as such, check pulmonary function testing, stop Flovent. I don't see any current contraindications to receiving her next Remicade infusion. Over 75% of this 40 minute visit was spent in counseling.      Please restart your fluticasone nasal spray and Allegra Please increase your omeprazole to 20 mg twice a day until next visit You may proceed with your Remicade infusion We will use Tussionex 5 mL every 12 hours for cough suppression Try to avoid throat clearing. Use sugar-free candy and swallow instead of clearing her throat Try to set aside a dedicated time for voice rest Stop Flovent You may still have albuterol/ipratropium nebulizers available to use as needed We will obtain a copy of your CT scan from [facility] If her cough continues we may consider inspecting your upper airway and bronchi for any anatomical abnormalities or thrush We will perform full pulmonary function testing at your next office visit Follow with Dr Lamonte Sakai in 1 month with full PFT

## 2014-07-29 NOTE — Patient Instructions (Signed)
Please restart your fluticasone nasal spray and Allegra Please increase your omeprazole to 20 mg twice a day until next visit You may proceed with your Remicade infusion We will use Tussionex 5 mL every 12 hours for cough suppression Try to avoid throat clearing. Use sugar-free candy and swallow instead of clearing her throat Try to set aside a dedicated time for voice rest Stop Flovent You may still have albuterol/ipratropium nebulizers available to use as needed We will obtain a copy of your CT scan from [facility] If her cough continues we may consider inspecting your upper airway and bronchi for any anatomical abnormalities or thrush We will perform full pulmonary function testing at your next office visit Follow with Dr Lamonte Sakai in 1 month with full PFT

## 2014-07-31 DIAGNOSIS — K50819 Crohn's disease of both small and large intestine with unspecified complications: Secondary | ICD-10-CM | POA: Diagnosis not present

## 2014-08-13 ENCOUNTER — Telehealth: Payer: Self-pay | Admitting: Emergency Medicine

## 2014-08-13 NOTE — Telephone Encounter (Signed)
Spoke with pt. Advised her that I have not received the CD of the CT that I requested. I have tried to contact the imaging center and I can't get anyone on the phone. Pt has an appointment in that area tomorrow and she will stop by there and pick one up.

## 2014-08-14 DIAGNOSIS — R0602 Shortness of breath: Secondary | ICD-10-CM | POA: Diagnosis not present

## 2014-08-14 DIAGNOSIS — J849 Interstitial pulmonary disease, unspecified: Secondary | ICD-10-CM | POA: Diagnosis not present

## 2014-08-17 DIAGNOSIS — J42 Unspecified chronic bronchitis: Secondary | ICD-10-CM | POA: Diagnosis not present

## 2014-08-17 DIAGNOSIS — Z6838 Body mass index (BMI) 38.0-38.9, adult: Secondary | ICD-10-CM | POA: Diagnosis not present

## 2014-08-17 DIAGNOSIS — R0602 Shortness of breath: Secondary | ICD-10-CM | POA: Diagnosis not present

## 2014-09-18 ENCOUNTER — Ambulatory Visit: Payer: Medicare Other | Admitting: Emergency Medicine

## 2014-09-23 DIAGNOSIS — K50819 Crohn's disease of both small and large intestine with unspecified complications: Secondary | ICD-10-CM | POA: Diagnosis not present

## 2014-10-06 DIAGNOSIS — Z1231 Encounter for screening mammogram for malignant neoplasm of breast: Secondary | ICD-10-CM | POA: Diagnosis not present

## 2014-10-29 DIAGNOSIS — E538 Deficiency of other specified B group vitamins: Secondary | ICD-10-CM | POA: Diagnosis not present

## 2014-11-18 DIAGNOSIS — K50819 Crohn's disease of both small and large intestine with unspecified complications: Secondary | ICD-10-CM | POA: Diagnosis not present

## 2014-11-23 DIAGNOSIS — Z23 Encounter for immunization: Secondary | ICD-10-CM | POA: Diagnosis not present

## 2014-11-23 DIAGNOSIS — K219 Gastro-esophageal reflux disease without esophagitis: Secondary | ICD-10-CM | POA: Diagnosis not present

## 2014-11-23 DIAGNOSIS — M069 Rheumatoid arthritis, unspecified: Secondary | ICD-10-CM | POA: Diagnosis not present

## 2014-11-23 DIAGNOSIS — J42 Unspecified chronic bronchitis: Secondary | ICD-10-CM | POA: Diagnosis not present

## 2014-11-23 DIAGNOSIS — R0602 Shortness of breath: Secondary | ICD-10-CM | POA: Diagnosis not present

## 2014-11-23 DIAGNOSIS — J219 Acute bronchiolitis, unspecified: Secondary | ICD-10-CM | POA: Diagnosis not present

## 2014-11-23 DIAGNOSIS — Z6838 Body mass index (BMI) 38.0-38.9, adult: Secondary | ICD-10-CM | POA: Diagnosis not present

## 2014-12-14 DIAGNOSIS — M533 Sacrococcygeal disorders, not elsewhere classified: Secondary | ICD-10-CM | POA: Diagnosis not present

## 2014-12-14 DIAGNOSIS — M059 Rheumatoid arthritis with rheumatoid factor, unspecified: Secondary | ICD-10-CM | POA: Diagnosis not present

## 2014-12-14 DIAGNOSIS — M0570 Rheumatoid arthritis with rheumatoid factor of unspecified site without organ or systems involvement: Secondary | ICD-10-CM | POA: Diagnosis not present

## 2014-12-14 DIAGNOSIS — Z8739 Personal history of other diseases of the musculoskeletal system and connective tissue: Secondary | ICD-10-CM | POA: Diagnosis not present

## 2014-12-14 DIAGNOSIS — M25475 Effusion, left foot: Secondary | ICD-10-CM | POA: Diagnosis not present

## 2014-12-14 DIAGNOSIS — K219 Gastro-esophageal reflux disease without esophagitis: Secondary | ICD-10-CM | POA: Diagnosis not present

## 2014-12-14 DIAGNOSIS — M79641 Pain in right hand: Secondary | ICD-10-CM | POA: Diagnosis not present

## 2014-12-14 DIAGNOSIS — Z6837 Body mass index (BMI) 37.0-37.9, adult: Secondary | ICD-10-CM | POA: Diagnosis not present

## 2014-12-14 DIAGNOSIS — M79642 Pain in left hand: Secondary | ICD-10-CM | POA: Diagnosis not present

## 2014-12-14 DIAGNOSIS — M85872 Other specified disorders of bone density and structure, left ankle and foot: Secondary | ICD-10-CM | POA: Diagnosis not present

## 2014-12-14 DIAGNOSIS — K50819 Crohn's disease of both small and large intestine with unspecified complications: Secondary | ICD-10-CM | POA: Diagnosis not present

## 2014-12-30 DIAGNOSIS — K50819 Crohn's disease of both small and large intestine with unspecified complications: Secondary | ICD-10-CM | POA: Diagnosis not present

## 2014-12-30 DIAGNOSIS — Z8739 Personal history of other diseases of the musculoskeletal system and connective tissue: Secondary | ICD-10-CM | POA: Diagnosis not present

## 2015-02-10 DIAGNOSIS — J219 Acute bronchiolitis, unspecified: Secondary | ICD-10-CM | POA: Diagnosis not present

## 2015-02-10 DIAGNOSIS — M069 Rheumatoid arthritis, unspecified: Secondary | ICD-10-CM | POA: Diagnosis not present

## 2015-02-10 DIAGNOSIS — L568 Other specified acute skin changes due to ultraviolet radiation: Secondary | ICD-10-CM | POA: Diagnosis not present

## 2015-02-10 DIAGNOSIS — Z23 Encounter for immunization: Secondary | ICD-10-CM | POA: Diagnosis not present

## 2015-02-10 DIAGNOSIS — L539 Erythematous condition, unspecified: Secondary | ICD-10-CM | POA: Diagnosis not present

## 2015-02-10 DIAGNOSIS — K7581 Nonalcoholic steatohepatitis (NASH): Secondary | ICD-10-CM | POA: Diagnosis not present

## 2015-02-10 DIAGNOSIS — Z79899 Other long term (current) drug therapy: Secondary | ICD-10-CM | POA: Diagnosis not present

## 2015-02-10 DIAGNOSIS — Z6838 Body mass index (BMI) 38.0-38.9, adult: Secondary | ICD-10-CM | POA: Diagnosis not present

## 2015-02-10 DIAGNOSIS — K50819 Crohn's disease of both small and large intestine with unspecified complications: Secondary | ICD-10-CM | POA: Diagnosis not present

## 2015-02-10 DIAGNOSIS — Z9049 Acquired absence of other specified parts of digestive tract: Secondary | ICD-10-CM | POA: Diagnosis not present

## 2016-01-10 ENCOUNTER — Other Ambulatory Visit (HOSPITAL_COMMUNITY): Payer: Self-pay | Admitting: Orthopedic Surgery

## 2016-01-10 ENCOUNTER — Ambulatory Visit (HOSPITAL_COMMUNITY)
Admission: RE | Admit: 2016-01-10 | Discharge: 2016-01-10 | Disposition: A | Discharge status: Home / Self Care | Payer: Medicare Other | Source: Ambulatory Visit | Attending: Internal Medicine | Admitting: Internal Medicine

## 2016-01-10 DIAGNOSIS — M5442 Lumbago with sciatica, left side: Secondary | ICD-10-CM | POA: Diagnosis present

## 2016-01-10 DIAGNOSIS — M7989 Other specified soft tissue disorders: Secondary | ICD-10-CM

## 2016-01-10 DIAGNOSIS — M79605 Pain in left leg: Secondary | ICD-10-CM

## 2016-01-10 NOTE — Progress Notes (Signed)
VASCULAR LAB PRELIMINARY  PRELIMINARY  PRELIMINARY  PRELIMINARY  Left lower extremity venous duplex completed.    Preliminary report:  There is no DVT or SVT noted in the left lower extremity.  However, there appears to be a probable muscle tear from low mid calf extending to proximal calf.   Called report to [facility].   Kirstine Jacquin, RVT 01/10/2016, 3:58 PM

## 2020-12-07 ENCOUNTER — Ambulatory Visit
Admission: RE | Admit: 2020-12-07 | Discharge: 2020-12-07 | Disposition: A | Discharge status: Home / Self Care | Payer: Medicare Other | Source: Ambulatory Visit | Attending: Family Medicine | Admitting: Family Medicine

## 2020-12-07 ENCOUNTER — Other Ambulatory Visit: Payer: Self-pay | Admitting: Family Medicine

## 2020-12-07 DIAGNOSIS — R109 Unspecified abdominal pain: Secondary | ICD-10-CM

## 2022-05-04 IMAGING — CT CT ABD-PELV W/O CM
2 of 4 series · 15 of 46 positions shown, 17 images · non-contrast
Comparison: [facility] CT Abdomen and Pelvis 11/22/2015.

CLINICAL DATA: 60-year-old female with 4 days of flank pain.

EXAM:
CT ABDOMEN AND PELVIS WITHOUT CONTRAST
TECHNIQUE: Multidetector CT imaging of the abdomen and pelvis was performed
following the standard protocol without IV contrast.

[Series 2: renal stone 5.00 br40 s3 axial · axial · 0.77mm/px · z∈[+1203,+1623]mm · 12 of 96 slices shown, 14 images]
[im 8/96  soft-tissue]
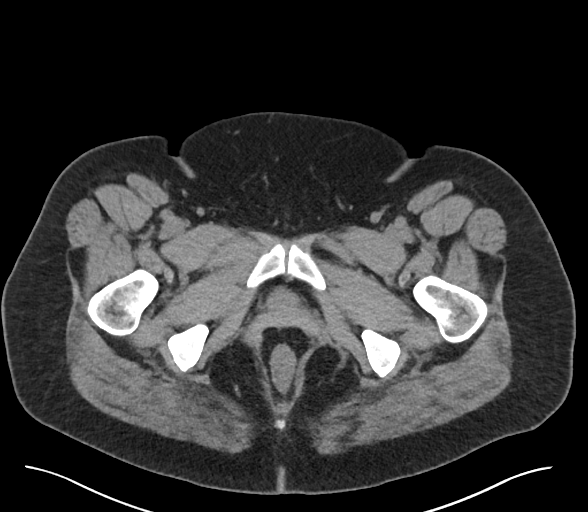
[im 8/96  bone]
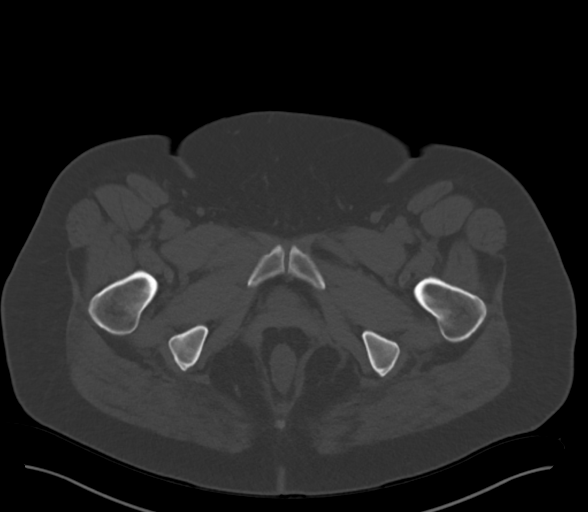
[im 16/96  soft-tissue]
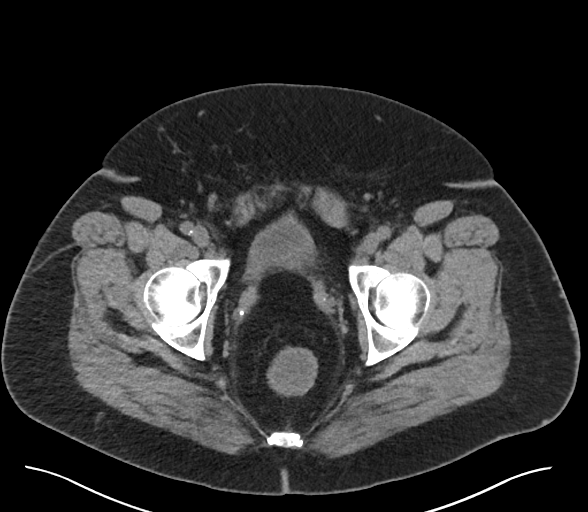
[im 23/96  soft-tissue]
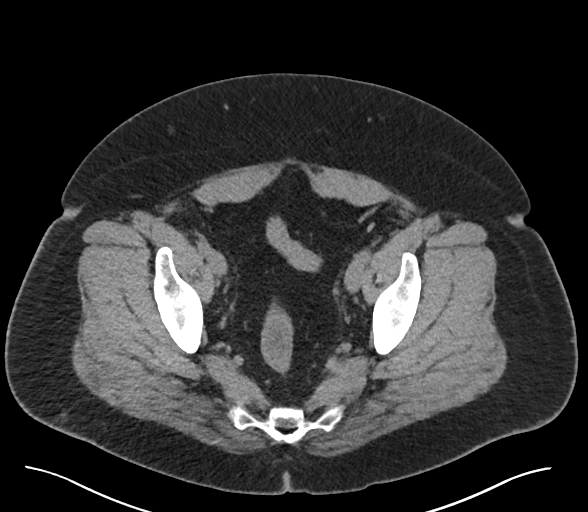
[im 31/96  soft-tissue]
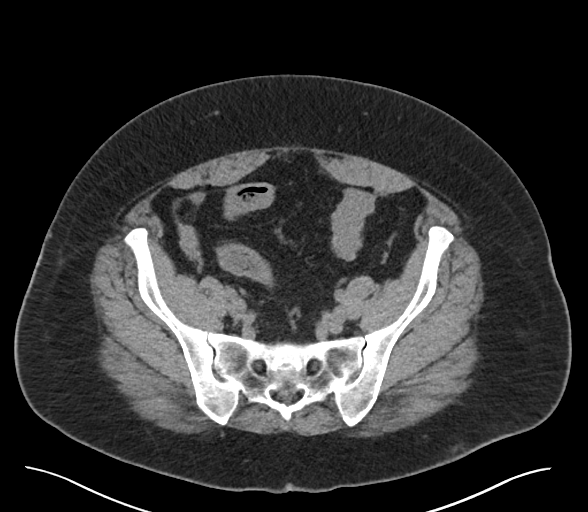
[im 39/96  soft-tissue]
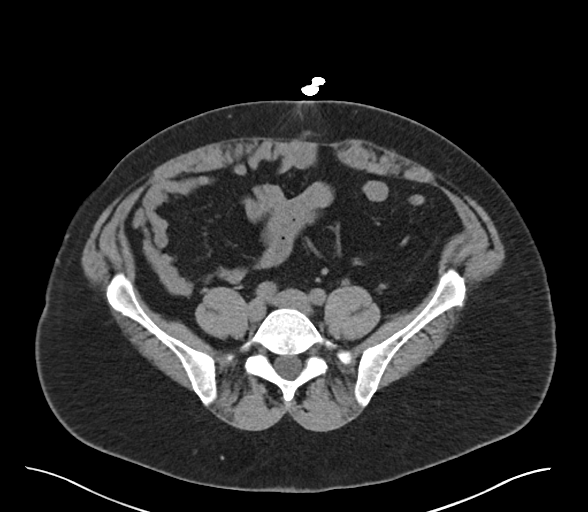
[im 46/96  soft-tissue]
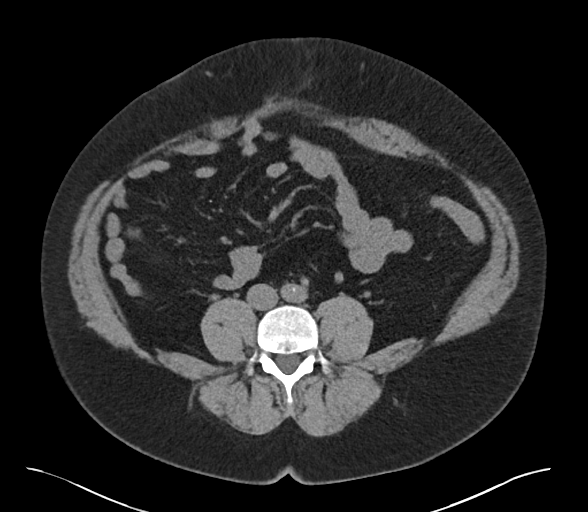
[im 54/96  soft-tissue]
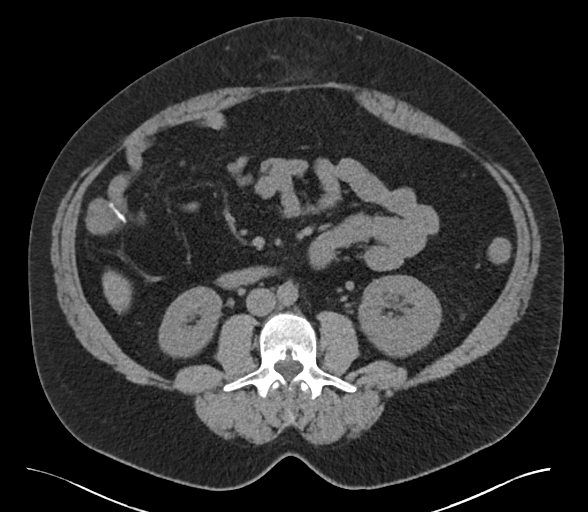
[im 61/96  soft-tissue]
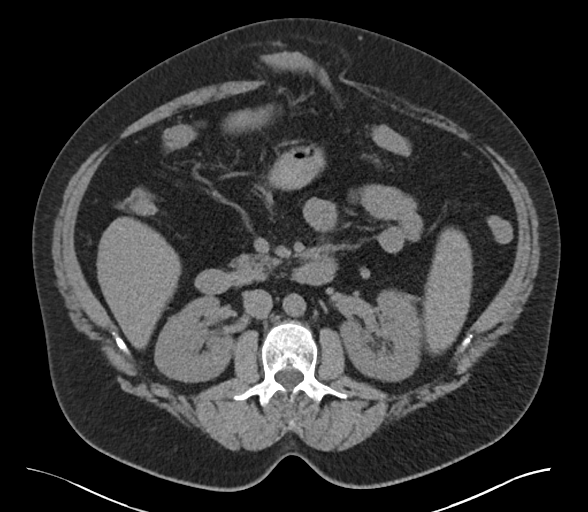
[im 69/96  soft-tissue]
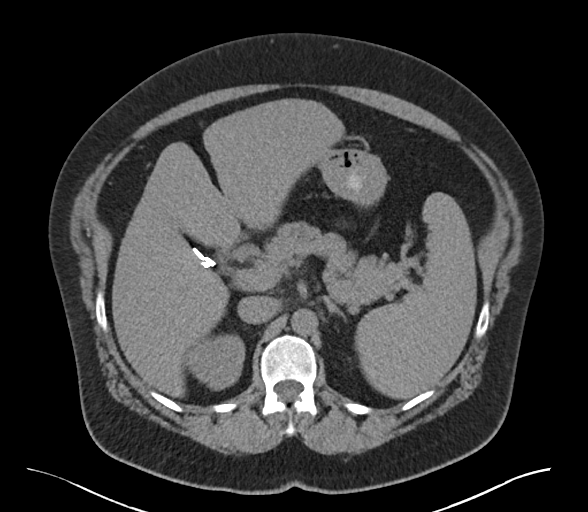
[im 69/96  bone]
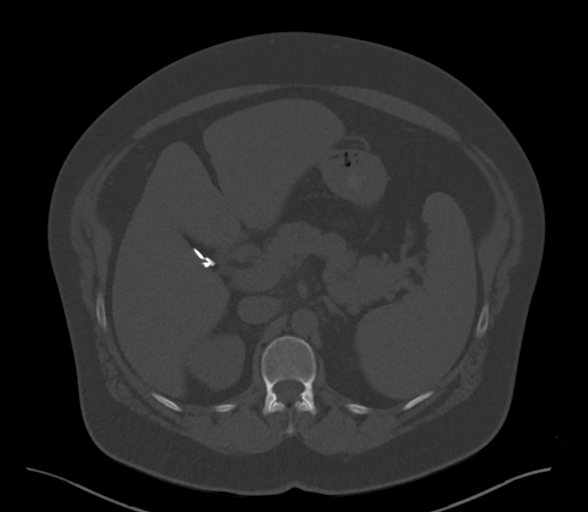
[im 77/96  soft-tissue]
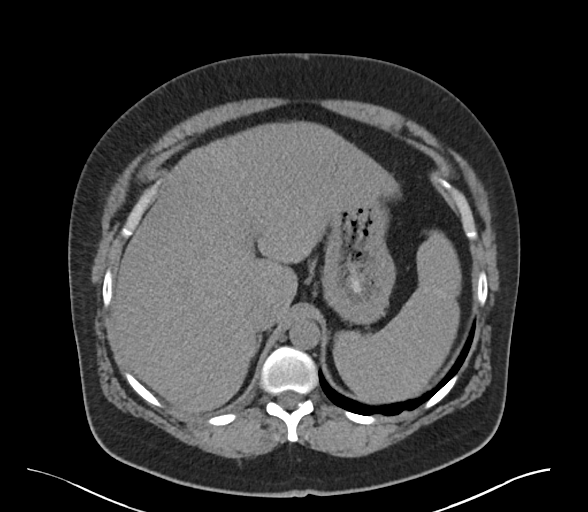
[im 84/96  soft-tissue]
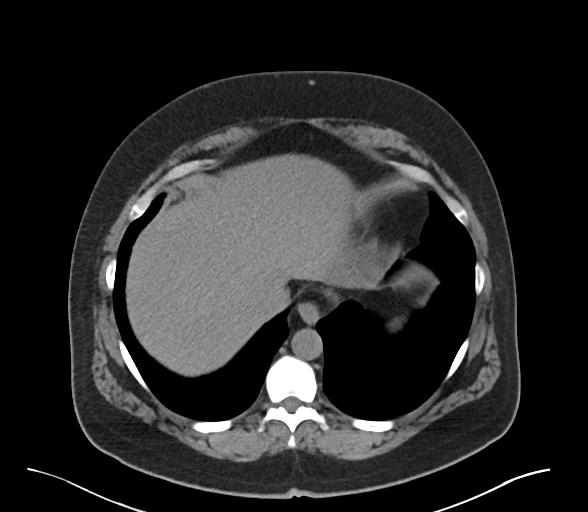
[im 92/96  soft-tissue]
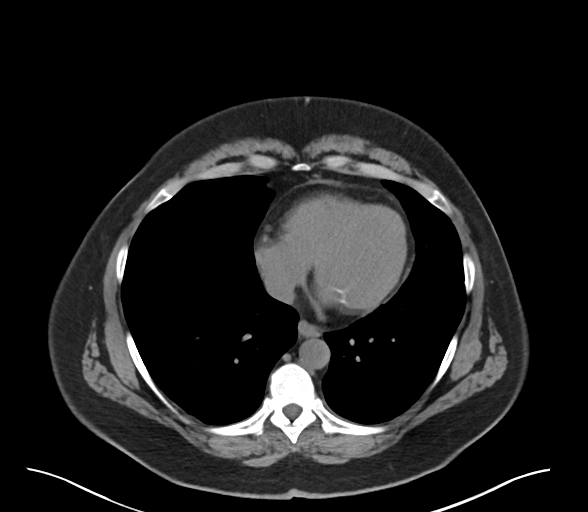

[Series 6: renal stone 2.00 br40 s3 cor · coronal · 0.88mm/px · 3 of 196 slices shown]
[im 66/196  soft-tissue]
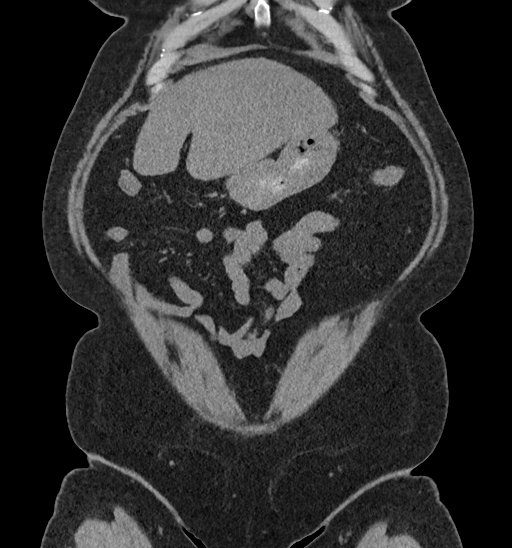
[im 87/196  soft-tissue]
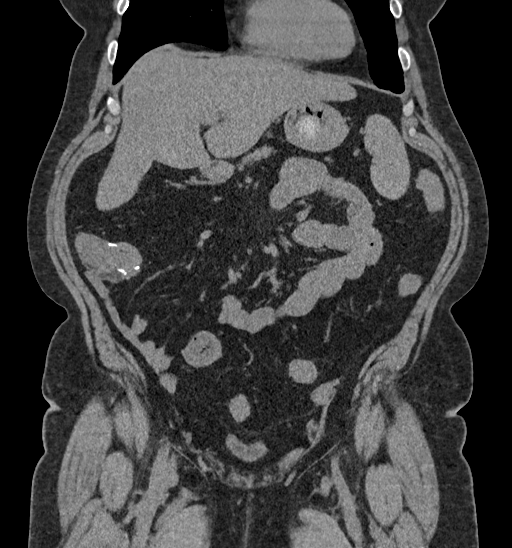
[im 109/196  soft-tissue]
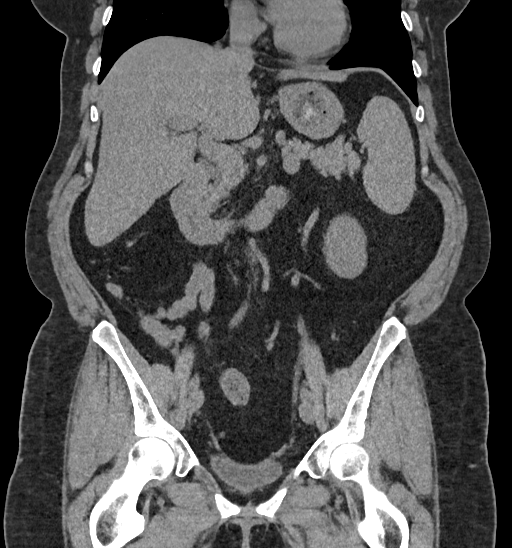

[15 of 46 positions shown; findings below may reference images not displayed]

FINDINGS: Lower chest: Small fat containing hiatal hernia, otherwise negative.

Hepatobiliary: Chronically absent gallbladder. Negative noncontrast
liver.

Pancreas: Negative.

Spleen: Chronic splenomegaly is stable since 5244. Estimated splenic
volume 810 mL (normal splenic volume range 83 - 412 mL).

Adrenals/Urinary Tract: Normal adrenal glands. Negative noncontrast
kidneys aside from possible punctate right nephrolithiasis on
coronal image 137. No hydronephrosis or perirenal inflammation. Both
ureters appear decompressed and negative. Decompressed and negative
urinary bladder. Small chronic pelvic phleboliths.

Stomach/Bowel: Mild diverticulosis of the sigmoid colon. Chronic
supraumbilical abdominal hernia contains a 5 cm segment of the mid
transverse colon and mesentery (series 2, image 38), not
significantly changed from 5244. Additional more subtle fat
containing periumbilical hernias visible on image 49. And additional
postoperative changes to the midline ventral lower abdominal wall
caudal to that.

Chronic postoperative changes to the right colon with neo terminal
ileum. No adverse features. No dilated small bowel. Negative stomach
and duodenum. No free air, free fluid, mesenteric inflammation.

Vascular/Lymphatic: Mild Calcified aortic atherosclerosis. Normal
caliber abdominal aorta. No lymphadenopathy.

Reproductive: Surgically absent uterus. Diminutive or absent
ovaries.

Other: No pelvic free fluid.

Musculoskeletal: Negative.
IMPRESSION: 1. Questionable punctate right nephrolithiasis, but no obstructive
uropathy.

2. No acute or inflammatory process identified in the non-contrast
abdomen or pelvis.

3. Chronic splenomegaly, stable since 5244 with hepatic steatosis
suspected at that time.
And chronic postoperative changes to the abdomen with
non-incarcerated ventral abdominal hernias, including one containing
a segment of the mid transverse colon and mesentery.

## 2024-02-01 ENCOUNTER — Other Ambulatory Visit (HOSPITAL_BASED_OUTPATIENT_CLINIC_OR_DEPARTMENT_OTHER): Payer: Self-pay

## 2024-02-01 ENCOUNTER — Encounter (HOSPITAL_BASED_OUTPATIENT_CLINIC_OR_DEPARTMENT_OTHER): Payer: Self-pay

## 2024-02-01 ENCOUNTER — Ambulatory Visit (HOSPITAL_BASED_OUTPATIENT_CLINIC_OR_DEPARTMENT_OTHER)
Admission: RE | Admit: 2024-02-01 | Discharge: 2024-02-01 | Disposition: A | Discharge status: Home / Self Care | Payer: Self-pay | Source: Ambulatory Visit

## 2024-02-01 VITALS — BP 151/86 | HR 77 | Temp 98.3°F | Resp 20

## 2024-02-01 DIAGNOSIS — J069 Acute upper respiratory infection, unspecified: Secondary | ICD-10-CM | POA: Diagnosis not present

## 2024-02-01 DIAGNOSIS — R051 Acute cough: Secondary | ICD-10-CM

## 2024-02-01 LAB — POC COVID19/FLU A&B COMBO
Covid Antigen, POC: NEGATIVE
Influenza A Antigen, POC: NEGATIVE
Influenza B Antigen, POC: NEGATIVE

## 2024-02-01 MED ORDER — PROMETHAZINE-DM 6.25-15 MG/5ML PO SYRP
5.0000 mL | ORAL_SOLUTION | Freq: Four times a day (QID) | ORAL | 0 refills | Status: AC | PRN
  Filled 2024-02-01: qty 118, 6d supply, fill #0

## 2024-02-01 MED ORDER — CETIRIZINE HCL 10 MG PO TABS
10.0000 mg | ORAL_TABLET | Freq: Every day | ORAL | 0 refills | Status: AC | PRN
  Filled 2024-02-01: qty 30, 30d supply, fill #0

## 2024-02-01 NOTE — ED Provider Notes (Signed)
 [facility]    CSN: [id number] Arrival date & time: 02/01/24  0851      History   Chief Complaint Chief Complaint  Patient presents with   Cough    Sore throat congestion - Entered by patient    HPI Jacqueline Merritt is a 63 y.o. female.   63 year old female with report of runny nose, cough, congestion.  Symptoms started during the night of 01/29/2024 and into the morning of 01/30/2024.  She has not had a fever but her cough has persisted and is getting deeper.  She denies nausea, vomiting, constipation, diarrhea.   Cough Associated symptoms: rhinorrhea and sore throat   Associated symptoms: no chest pain, no chills, no ear pain, no fever, no rash and no shortness of breath     Past Medical History:  Diagnosis Date   Crohn's disease (HCC)    Rheumatoid arthritis (HCC)     Patient Active Problem List   Diagnosis Date Noted   Chronic cough 07/29/2014    Past Surgical History:  Procedure Laterality Date   ABDOMINAL HYSTERECTOMY     BREAST SURGERY     CHOLECYSTECTOMY     COLON SURGERY     HERNIA REPAIR     TUBAL LIGATION      OB History   No obstetric history on file.      Home Medications    Prior to Admission medications   Medication Sig Start Date End Date Taking? Authorizing Provider  cetirizine  (ZYRTEC ) 10 MG tablet Take 1 tablet (10 mg total) by mouth daily as needed for allergies. 02/01/24  Yes Ival Domino, FNP  hydroxychloroquine (PLAQUENIL) 200 MG tablet Take 200 mg by mouth. 09/19/22  Yes [provider]  promethazine -dextromethorphan (PROMETHAZINE -DM) 6.25-15 MG/5ML syrup Take 5 mLs by mouth 4 (four) times daily as needed for cough. Do not use and drive - May make drowsy. 02/01/24  Yes Ival Domino, FNP  sertraline (ZOLOFT) 100 MG tablet Take 100 mg by mouth. 02/19/12  Yes [provider]  Cholecalciferol (VITAMIN D-3) 1000 UNITS CAPS Take 1 capsule by mouth daily.    [provider]  inFLIXimab   (REMICADE ) 100 MG injection Inject into the vein.    [provider]  omeprazole (PRILOSEC) 20 MG capsule Take 20 mg by mouth daily.    [provider]    Family History History reviewed. No pertinent family history.  Social History Social History   Tobacco Use   Smoking status: Never  Vaping Use   Vaping status: Never Used  Substance Use Topics   Alcohol use: No    Alcohol/week: 0.0 standard drinks of alcohol   Drug use: No     Allergies   Sulfa antibiotics   Review of Systems Review of Systems  Constitutional:  Negative for chills and fever.  HENT:  Positive for congestion, postnasal drip, rhinorrhea and sore throat. Negative for ear pain.   Eyes:  Negative for pain and visual disturbance.  Respiratory:  Positive for cough. Negative for shortness of breath.   Cardiovascular:  Negative for chest pain and palpitations.  Gastrointestinal:  Negative for abdominal pain, constipation, diarrhea, nausea and vomiting.  Genitourinary:  Negative for dysuria and hematuria.  Musculoskeletal:  Negative for arthralgias and back pain.  Skin:  Negative for color change and rash.  Neurological:  Negative for seizures and syncope.  All other systems reviewed and are negative.    Physical Exam Triage Vital Signs ED Triage Vitals  Encounter Vitals Group  BP 02/01/24 0938 (!) 151/86     Girls Systolic BP Percentile --      Girls Diastolic BP Percentile --      Boys Systolic BP Percentile --      Boys Diastolic BP Percentile --      Pulse Rate 02/01/24 0938 77     Resp 02/01/24 0938 20     Temp 02/01/24 0938 98.3 F (36.8 C)     Temp Source 02/01/24 0938 Oral     SpO2 02/01/24 0938 96 %     Weight --      Height --      Head Circumference --      Peak Flow --      Pain Score 02/01/24 0935 2     Pain Loc --      Pain Education --      Exclude from Growth Chart --    No data found.  Updated Vital Signs BP (!) 151/86 (BP Location: Right Arm)   Pulse  77   Temp 98.3 F (36.8 C) (Oral)   Resp 20   SpO2 96%   Visual Acuity Right Eye Distance:   Left Eye Distance:   Bilateral Distance:    Right Eye Near:   Left Eye Near:    Bilateral Near:     Physical Exam Vitals and nursing note reviewed.  Constitutional:      General: She is not in acute distress.    Appearance: She is well-developed. She is not ill-appearing, toxic-appearing or diaphoretic.  HENT:     Head: Normocephalic and atraumatic.     Right Ear: Hearing, tympanic membrane, ear canal and external ear normal.     Left Ear: Hearing, tympanic membrane, ear canal and external ear normal.     Nose: Mucosal edema, congestion and rhinorrhea present. Rhinorrhea is clear.     Right Sinus: No maxillary sinus tenderness or frontal sinus tenderness.     Left Sinus: No maxillary sinus tenderness or frontal sinus tenderness.     Mouth/Throat:     Lips: Pink.     Mouth: Mucous membranes are moist.     Pharynx: Uvula midline. No oropharyngeal exudate or posterior oropharyngeal erythema.     Tonsils: No tonsillar exudate.  Eyes:     Conjunctiva/sclera: Conjunctivae normal.     Pupils: Pupils are equal, round, and reactive to light.  Cardiovascular:     Rate and Rhythm: Normal rate and regular rhythm.     Heart sounds: S1 normal and S2 normal. No murmur heard. Pulmonary:     Effort: Pulmonary effort is normal. No respiratory distress.     Breath sounds: Normal breath sounds. No decreased breath sounds, wheezing, rhonchi or rales.  Abdominal:     General: Bowel sounds are normal.     Palpations: Abdomen is soft.     Tenderness: There is no abdominal tenderness.  Musculoskeletal:        General: No swelling.     Cervical back: Neck supple.  Lymphadenopathy:     Head:     Right side of head: No submental, submandibular, tonsillar, preauricular or posterior auricular adenopathy.     Left side of head: No submental, submandibular, tonsillar, preauricular or posterior auricular  adenopathy.     Cervical: No cervical adenopathy.     Right cervical: No superficial cervical adenopathy.    Left cervical: No superficial cervical adenopathy.  Skin:    General: Skin is warm and dry.  Capillary Refill: Capillary refill takes less than 2 seconds.     Findings: No rash.  Neurological:     Mental Status: She is alert and oriented to person, place, and time.  Psychiatric:        Mood and Affect: Mood normal.   Acute   UC Treatments / Results  Labs (all labs ordered are listed, but only abnormal results are displayed) Labs Reviewed  POC COVID19/FLU A&B COMBO - Normal    EKG   Radiology No results found.  Procedures Procedures (including critical care time)  Medications Ordered in UC Medications - No data to display  Initial Impression / Assessment and Plan / UC Course  I have reviewed the triage vital signs and the nursing notes.  Pertinent labs & imaging results that were available during my care of the patient were reviewed by me and considered in my medical decision making (see chart for details).  Plan of Care: Upper respiratory infection with cough: Rapid flu and COVID is negative.  Cetirizine, 10 mg, 1 pill up to twice daily if needed for nasal congestion.  Do not use and drive, if it makes you drowsy.  Promethazine DM, 5 mL, every 6 hours if needed for cough.  Get plenty of fluids and rest.  Follow-up if symptoms do not improve, worsen or new symptoms occur.  I reviewed the plan of care with the patient and/or the patient's guardian.  The patient and/or guardian had time to ask questions and acknowledged that the questions were answered.  I provided instruction on symptoms or reasons to return here or to go to an ER, if symptoms/condition did not improve, worsened or if new symptoms occurred.  Final Clinical Impressions(s) / UC Diagnoses   Final diagnoses:  Acute cough  Viral URI     Discharge Instructions      Upper respiratory  infection with cough: Rapid flu and COVID is negative.  Cetirizine, 10 mg, 1 pill up to twice daily if needed for nasal congestion.  Do not use and drive, if it makes you drowsy.  Promethazine DM, 5 mL, every 6 hours if needed for cough.  Get plenty of fluids and rest.  Follow-up if symptoms do not improve, worsen or new symptoms occur.     ED Prescriptions     Medication Sig Dispense Auth. Provider   promethazine-dextromethorphan (PROMETHAZINE-DM) 6.25-15 MG/5ML syrup Take 5 mLs by mouth 4 (four) times daily as needed for cough. Do not use and drive - May make drowsy. 118 mL Ival Domino, FNP   cetirizine (ZYRTEC) 10 MG tablet Take 1 tablet (10 mg total) by mouth daily as needed for allergies. 30 tablet Cricket Goodlin, FNP      PDMP not reviewed this encounter.   Ival Domino, FNP 02/01/24 1045

## 2024-02-01 NOTE — Discharge Instructions (Addendum)
 Upper respiratory infection with cough: Rapid flu and COVID is negative.  Cetirizine, 10 mg, 1 pill up to twice daily if needed for nasal congestion.  Do not use and drive, if it makes you drowsy.  Promethazine DM, 5 mL, every 6 hours if needed for cough.  Get plenty of fluids and rest.  Follow-up if symptoms do not improve, worsen or new symptoms occur.

## 2024-02-01 NOTE — ED Triage Notes (Signed)
 Pt c/o cough-productive, nasal congestion, slight sore throat, and chest congestion. She has taken delsym and dayquil with slight relief.
